# Patient Record
Sex: Female | Born: 1980 | Race: White | Hispanic: No | Marital: Married | Smoking: Former smoker
Health system: Southern US, Community
[De-identification: ages and names within clinical notes are randomized; demographics above are authoritative.]

## PROBLEM LIST (undated history)

## (undated) DIAGNOSIS — K219 Gastro-esophageal reflux disease without esophagitis: Secondary | ICD-10-CM

## (undated) DIAGNOSIS — I4711 Inappropriate sinus tachycardia, so stated: Secondary | ICD-10-CM

## (undated) DIAGNOSIS — R000 Tachycardia, unspecified: Secondary | ICD-10-CM

## (undated) HISTORY — PX: TONSILLECTOMY: SUR1361

## (undated) HISTORY — DX: Inappropriate sinus tachycardia, so stated: I47.11

## (undated) HISTORY — DX: Gastro-esophageal reflux disease without esophagitis: K21.9

## (undated) HISTORY — DX: Tachycardia, unspecified: R00.0

---

## 1998-06-10 ENCOUNTER — Other Ambulatory Visit: Admission: RE | Admit: 1998-06-10 | Discharge: 1998-06-10 | Payer: Self-pay | Admitting: Obstetrics & Gynecology

## 1998-11-04 ENCOUNTER — Other Ambulatory Visit: Admission: RE | Admit: 1998-11-04 | Discharge: 1998-11-04 | Payer: Self-pay | Admitting: Obstetrics & Gynecology

## 1999-07-12 ENCOUNTER — Other Ambulatory Visit: Admission: RE | Admit: 1999-07-12 | Discharge: 1999-07-12 | Payer: Self-pay | Admitting: Obstetrics & Gynecology

## 2000-07-26 ENCOUNTER — Encounter: Admission: RE | Admit: 2000-07-26 | Discharge: 2000-07-26 | Payer: Self-pay | Admitting: Obstetrics & Gynecology

## 2000-07-26 ENCOUNTER — Encounter: Payer: Self-pay | Admitting: Obstetrics & Gynecology

## 2000-07-26 ENCOUNTER — Other Ambulatory Visit: Admission: RE | Admit: 2000-07-26 | Discharge: 2000-07-26 | Payer: Self-pay | Admitting: Obstetrics & Gynecology

## 2000-08-23 ENCOUNTER — Other Ambulatory Visit: Admission: RE | Admit: 2000-08-23 | Discharge: 2000-08-23 | Payer: Self-pay | Admitting: Obstetrics & Gynecology

## 2000-11-28 ENCOUNTER — Other Ambulatory Visit: Admission: RE | Admit: 2000-11-28 | Discharge: 2000-11-28 | Payer: Self-pay | Admitting: Obstetrics & Gynecology

## 2001-06-05 ENCOUNTER — Other Ambulatory Visit: Admission: RE | Admit: 2001-06-05 | Discharge: 2001-06-05 | Payer: Self-pay | Admitting: Obstetrics & Gynecology

## 2001-12-17 ENCOUNTER — Other Ambulatory Visit: Admission: RE | Admit: 2001-12-17 | Discharge: 2001-12-17 | Payer: Self-pay | Admitting: Obstetrics & Gynecology

## 2002-07-12 ENCOUNTER — Other Ambulatory Visit: Admission: RE | Admit: 2002-07-12 | Discharge: 2002-07-12 | Payer: Self-pay | Admitting: Obstetrics & Gynecology

## 2003-07-16 ENCOUNTER — Other Ambulatory Visit: Admission: RE | Admit: 2003-07-16 | Discharge: 2003-07-16 | Payer: Self-pay | Admitting: Obstetrics & Gynecology

## 2004-07-28 ENCOUNTER — Other Ambulatory Visit: Admission: RE | Admit: 2004-07-28 | Discharge: 2004-07-28 | Payer: Self-pay | Admitting: Obstetrics & Gynecology

## 2010-12-20 ENCOUNTER — Other Ambulatory Visit (HOSPITAL_COMMUNITY): Payer: Self-pay | Admitting: Obstetrics & Gynecology

## 2010-12-20 DIAGNOSIS — N979 Female infertility, unspecified: Secondary | ICD-10-CM

## 2010-12-24 ENCOUNTER — Ambulatory Visit (HOSPITAL_COMMUNITY)
Admission: RE | Admit: 2010-12-24 | Discharge: 2010-12-24 | Disposition: A | Discharge status: Home / Self Care | Payer: BC Managed Care – PPO | Source: Ambulatory Visit | Attending: Obstetrics & Gynecology | Admitting: Obstetrics & Gynecology

## 2010-12-24 DIAGNOSIS — N979 Female infertility, unspecified: Secondary | ICD-10-CM

## 2010-12-24 MED ORDER — IOHEXOL 300 MG/ML  SOLN: 5.0000 mL | Freq: Once | INTRAMUSCULAR | Status: AC | PRN

## 2011-09-23 ENCOUNTER — Encounter: Payer: Self-pay | Admitting: Internal Medicine

## 2011-10-25 ENCOUNTER — Encounter: Payer: Self-pay | Admitting: Internal Medicine

## 2011-10-26 ENCOUNTER — Ambulatory Visit (INDEPENDENT_AMBULATORY_CARE_PROVIDER_SITE_OTHER): Payer: BC Managed Care – PPO | Admitting: Internal Medicine

## 2011-10-26 ENCOUNTER — Encounter: Payer: Self-pay | Admitting: Internal Medicine

## 2011-10-26 VITALS — BP 110/72 | HR 59 | Ht 65.0 in | Wt 140.2 lb

## 2011-10-26 DIAGNOSIS — R1013 Epigastric pain: Secondary | ICD-10-CM | POA: Insufficient documentation

## 2011-10-26 DIAGNOSIS — K5900 Constipation, unspecified: Secondary | ICD-10-CM

## 2011-10-26 DIAGNOSIS — R1011 Right upper quadrant pain: Secondary | ICD-10-CM

## 2011-10-26 DIAGNOSIS — R000 Tachycardia, unspecified: Secondary | ICD-10-CM

## 2011-10-26 DIAGNOSIS — K219 Gastro-esophageal reflux disease without esophagitis: Secondary | ICD-10-CM

## 2011-10-26 MED ORDER — PANTOPRAZOLE SODIUM 40 MG PO TBEC: 40.0000 mg | DELAYED_RELEASE_TABLET | Freq: Every day | ORAL | Status: DC

## 2011-10-26 MED ORDER — POLYETHYLENE GLYCOL 3350 17 GM/SCOOP PO POWD: 17.0000 g | Freq: Every day | ORAL | Status: AC

## 2011-10-26 NOTE — Progress Notes (Signed)
Patient ID: Catherine Sanders, female   DOB: August 25, 1980, 31 y.o.   MRN: [id number]  SUBJECTIVE: HPI Catherine Sanders is a 31 yo female with past medical history of tachycardia who is seen for evaluation of epigastric and right upper quadrant abdominal pain, heartburn and constipation. The patient reports ongoing issues with severe heartburn over the last 1 year. For this she has been using Rolaids, and also tried Prilosec which helped somewhat, but seemed to worsen her constipation and thus he discontinued this medication. She does report globus sensation associated with her heartburn as well as some nausea which is worse in the afternoon. She is not vomiting. Occasionally she feels issues with swallowing, but food does not stick, and relates this more to the globus sensation. She also reports epigastric and right upper quadrant pain which is worse after eating. This pain usually starts 20 minutes after eating, and has not woken her from sleep. She also reports some issues with ongoing constipation which has been worse over the past 2 years. She reports hard stools occurring every 3-4 days. In years past she has gone once to twice daily. She has tried over-the-counter senna but was nervous to take this on a daily basis. She is also tried stool softeners which has helped a little. She's had no fevers or chills. No melena or hematochezia. She does not use NSAIDs.  Review of Systems  As per history of present illness, otherwise negative   Past Medical History  Diagnosis Date  . Inappropriate sinus node tachycardia     Current Outpatient Prescriptions  Medication Sig Dispense Refill  . metoprolol succinate (TOPROL-XL) 25 MG 24 hr tablet Take 25 mg by mouth daily.      . pantoprazole (PROTONIX) 40 MG tablet Take 1 tablet (40 mg total) by mouth daily.  30 tablet  1  . polyethylene glycol powder (GLYCOLAX/MIRALAX) powder Take 17 g by mouth daily.  255 g  3  . DISCONTD: pantoprazole (PROTONIX) 40 MG tablet Take 1  tablet (40 mg total) by mouth daily.  30 tablet  1    No Known Allergies  Family History  Problem Relation Age of Onset  . Diabetes Maternal Grandmother   . Throat cancer Paternal Uncle     Tobacco user    History  Substance Use Topics  . Smoking status: Current Everyday Smoker    Types: Cigarettes  . Smokeless tobacco: Never Used  . Alcohol Use: Yes    OBJECTIVE: BP 110/72  Pulse 59  Ht 5\' 5"  (1.651 m)  Wt 140 lb 3.2 oz (63.594 kg)  BMI 23.33 kg/m2  SpO2 98%  LMP 10/10/2011 Constitutional: Well-developed and well-nourished. No distress. HEENT: Normocephalic and atraumatic. Oropharynx is clear and moist. No oropharyngeal exudate. Conjunctivae are normal. Pupils are equal round and reactive to light. No scleral icterus. Neck: Neck supple. Trachea midline. Cardiovascular: Normal rate, regular rhythm and intact distal pulses. No M/R/G Pulmonary/chest: Effort normal and breath sounds normal. No wheezing, rales or rhonchi. Abdominal: Soft, nontender, nondistended. Bowel sounds active throughout. There are no masses palpable. No hepatosplenomegaly. Extremities: no clubbing, cyanosis, or edema Lymphadenopathy: No cervical adenopathy noted. Neurological: Alert and oriented to person place and time. Skin: Skin is warm and dry. No rashes noted. Psychiatric: Normal mood and affect. Behavior is normal.  ASSESSMENT AND PLAN: 31 yo female with past medical history of tachycardia who is seen for evaluation of epigastric and right upper quadrant abdominal pain, heartburn and constipation.  1. Epigastric/RUQ pain/heartburn --  the  patient's symptoms are most consistent with GERD and ulcer-like dyspepsia. Gallbladder dysfunction/biliary tract disease is felt less likely at this point, however I would like to perform an ultrasound of her abdomen to better evaluate her gallbladder and rule out stones and sludge. I will start her on Protonix 40 mg daily, and advised her to take this 30 minutes  to one hour before her first meal the day. She voices understanding. We've also discussed foods which can make reflux worse such as acidic foods, tobacco, alcohol, and caffeine.  2. Constipation -- her constipation seems most consistent with irritable bowel given her intermittent lower abdominal cramping. I would like to start her on MiraLAX 17 g daily. If her stools are too loose she can take this every other day, but I expect she will need to take it on a consistent basis to get relief. We discussed how we have other options regarding laxatives, but we will start with this and gauge her response at followup  She will return in 6 weeks or sooner if needed

## 2011-10-26 NOTE — Patient Instructions (Addendum)
You have been scheduled for an Abdominal U/S at [facility] on 10/28/2011  @ 9:00am Please arrive to Radiology at 8:45am have nothing to eat or drink after midnight before you U/S  We have sent the following medications to your pharmacy for you to pick up at your convenience: Protonix, Miralax; Please take as directed  Follow up with Dr. Margretta Sidle in office in 6 weeks

## 2011-10-28 ENCOUNTER — Ambulatory Visit (HOSPITAL_COMMUNITY)
Admission: RE | Admit: 2011-10-28 | Discharge: 2011-10-28 | Disposition: A | Discharge status: Home / Self Care | Payer: BC Managed Care – PPO | Source: Ambulatory Visit | Attending: Internal Medicine | Admitting: Internal Medicine

## 2011-10-28 DIAGNOSIS — K219 Gastro-esophageal reflux disease without esophagitis: Secondary | ICD-10-CM

## 2011-10-28 DIAGNOSIS — K5900 Constipation, unspecified: Secondary | ICD-10-CM | POA: Insufficient documentation

## 2011-12-12 ENCOUNTER — Encounter: Payer: Self-pay | Admitting: Internal Medicine

## 2011-12-13 ENCOUNTER — Encounter: Payer: Self-pay | Admitting: Internal Medicine

## 2011-12-13 ENCOUNTER — Ambulatory Visit (INDEPENDENT_AMBULATORY_CARE_PROVIDER_SITE_OTHER): Payer: BC Managed Care – PPO | Admitting: Internal Medicine

## 2011-12-13 VITALS — BP 100/60 | HR 90 | Ht 65.0 in | Wt 145.0 lb

## 2011-12-13 DIAGNOSIS — K5900 Constipation, unspecified: Secondary | ICD-10-CM

## 2011-12-13 DIAGNOSIS — K219 Gastro-esophageal reflux disease without esophagitis: Secondary | ICD-10-CM

## 2011-12-13 MED ORDER — POLYETHYLENE GLYCOL 3350 17 GM/SCOOP PO POWD: 17.0000 g | Freq: Every day | ORAL | Status: DC

## 2011-12-13 MED ORDER — PANTOPRAZOLE SODIUM 40 MG PO TBEC: 40.0000 mg | DELAYED_RELEASE_TABLET | Freq: Every day | ORAL | Status: DC

## 2011-12-13 NOTE — Progress Notes (Signed)
Subjective:    Patient ID: Catherine Sanders, female    DOB: 01-Oct-1980, 31 y.o.   MRN: [id number]  HPI [name] is a 31 yo female with past medical history of tachycardia who is seen in followup of right upper quadrant abdominal pain heartburn constipation.  Since her last visit she has been maintained on pantoprazole 40 mg daily. She's also been using MiraLAX 17 g daily. She reports she is much improved. Her heartburn has completely resolved and she is now able to eat what she desires without pain. She's also no longer having upper abdominal pain. Appetite has been good. She did have one day of globus sensation which occurred during a vacation, but this also resolved. She feels this may have been related to change in diet on her vacation. She denies dysphagia, odynophagia, or globus today. No melena or rectal bleeding. Her constipation is also responded very nicely to MiraLAX. She is having 2 soft but formed bowel movements daily. She's also recently stopped smoking and plans to try hard not to restart  Review of Systems As per history of present illness, otherwise negative  Current Medications, Allergies, Past Medical History, Past Surgical History, Family History and Social History were reviewed in [employer] record.     Objective:   Physical Exam BP 100/60  Pulse 90  Ht 5\' 5"  (1.651 m)  Wt 145 lb (65.772 kg)  BMI 24.13 kg/m2 Constitutional: Well-developed and well-nourished. No distress. HEENT: Normocephalic and atraumatic. Oropharynx is clear and moist. No oropharyngeal exudate. Conjunctivae are normal.  No scleral icterus. Cardiovascular: Normal rate, regular rhythm and intact distal pulses. No M/R/G Pulmonary/chest: Effort normal and breath sounds normal. No wheezing, rales or rhonchi. Abdominal: Soft, nontender, nondistended. Bowel sounds active throughout. There are no masses palpable. No hepatosplenomegaly. Extremities: no clubbing, cyanosis, or  edema Neurological: Alert and oriented to person place and time. Skin: Skin is warm and dry. No rashes noted. Psychiatric: Normal mood and affect. Behavior is normal.  COMPLETE ABDOMINAL ULTRASOUND 10/26/2011   Comparison:  None.   Findings:   Gallbladder:  No gallstones, gallbladder wall thickening, or pericholecystic fluid.   Common bile duct:   Normal caliber, 5 mm.   Liver:  No focal lesion identified.  Within normal limits in parenchymal echogenicity.   IVC:  Appears normal.   Pancreas:  No focal abnormality seen.   Spleen:  Within normal limits in size and echotexture.   Right Kidney:   Normal in size and parenchymal echogenicity.  No evidence of mass or hydronephrosis.   Left Kidney:  Normal in size and parenchymal echogenicity.  No evidence of mass or hydronephrosis.   Abdominal aorta:  No aneurysm identified.   IMPRESSION: Negative abdominal ultrasound.      Assessment & Plan:  31 yo female with past medical history of tachycardia who is seen in followup of right upper quadrant abdominal pain heartburn constipation  1.  GERD -- the patient's heartburn and reflux symptoms have completely resolved with pantoprazole. She will continue pantoprazole 40 mg daily. We discussed how she can use this medication daily going forward, but also after 2 or 3 months she can try to wean off and gauge her symptoms. If she does need this medication long-term, we discussed the small associated with osteopenia and long-term use of PPI. If she does take this medication long-term, I encourage bone density study when age appropriate.  2.  Constipation -- great response to MiraLAX, she can continue 17 g daily.  She is thinking about becoming pregnant, and I've encouraged her to discuss the long-term use of pantoprazole and MiraLAX during pregnancy with her obstetrician. I will defer to their advice regarding these medications during pregnancy.

## 2011-12-13 NOTE — Patient Instructions (Addendum)
We have sent the following medications to your pharmacy for you to pick up at your convenience: Protonix   Follow up with Dr. Terri Piedra in 1 year

## 2012-12-04 ENCOUNTER — Other Ambulatory Visit: Payer: Self-pay | Admitting: Internal Medicine

## 2012-12-12 ENCOUNTER — Encounter: Payer: Self-pay | Admitting: Internal Medicine

## 2012-12-18 ENCOUNTER — Encounter: Payer: Self-pay | Admitting: Internal Medicine

## 2012-12-18 ENCOUNTER — Ambulatory Visit (INDEPENDENT_AMBULATORY_CARE_PROVIDER_SITE_OTHER): Payer: BC Managed Care – PPO | Admitting: Internal Medicine

## 2012-12-18 VITALS — BP 102/68 | HR 64 | Ht 65.0 in | Wt 143.4 lb

## 2012-12-18 DIAGNOSIS — K219 Gastro-esophageal reflux disease without esophagitis: Secondary | ICD-10-CM

## 2012-12-18 DIAGNOSIS — K5909 Other constipation: Secondary | ICD-10-CM

## 2012-12-18 DIAGNOSIS — K5900 Constipation, unspecified: Secondary | ICD-10-CM

## 2012-12-18 MED ORDER — PANTOPRAZOLE SODIUM 40 MG PO TBEC: 40.0000 mg | DELAYED_RELEASE_TABLET | Freq: Every day | ORAL | Status: DC

## 2012-12-18 NOTE — Progress Notes (Signed)
  Subjective:    Patient ID: Catherine Sanders, female    DOB: Aug 26, 1980, 32 y.o.   MRN: [id number]  HPI Catherine Sanders is a 32 yo female seen in followup for her history of GERD and CIC.  She is here alone today. She was last seen in October 2013.  On initial evaluation she was having trouble with daily heartburn and right upper quadrant abdominal pain. She was started on pantoprazole and her symptoms dramatically improved. She returns today stating she is feeling very well. She has continued pantoprazole 40 mg daily. She denies having breakthrough heartburn. No epigastric or right upper quadrant abdominal pain. She is eating well with no nausea or vomiting. No dysphagia or odynophagia. Bowel habits been regular when using MiraLax. When she stops MiraLax she does have return of mild constipation. She is using MiraLax every day to every other day with good result. No blood in her stool or melena. She is trying actively to get pregnant and working with a fertility specialist. She is about to undergo IUI.     Review of Systems As per history of present illness, otherwise negative  Current Medications, Allergies, Past Medical History, Past Surgical History, Family History and Social History were reviewed in [employer] record.     Objective:   Physical Exam BP 102/68  Pulse 64  Ht 5\' 5"  (1.651 m)  Wt 143 lb 6.4 oz (65.046 kg)  BMI 23.86 kg/m2  LMP 12/14/2012 Constitutional: Well-developed and well-nourished. No distress. HEENT: Normocephalic and atraumatic. Oropharynx is clear and moist. No oropharyngeal exudate. Conjunctivae are normal.  No scleral icterus. Cardiovascular: Normal rate, regular rhythm and intact distal pulses. No M/R/G Pulmonary/chest: Effort normal and breath sounds normal. No wheezing, rales or rhonchi. Abdominal: Soft, nontender, nondistended. Bowel sounds active throughout. There are no masses palpable. No hepatosplenomegaly. Extremities: no clubbing,  cyanosis, or edema Neurological: Alert and oriented to person place and time. Skin: Skin is warm and dry. No rashes noted. Psychiatric: Normal mood and affect. Behavior is normal.     Assessment & Plan:  32 yo female seen in followup for her history of GERD and CIC.    1.  GERD -- well-controlled on daily PPI. We have again discussed daily use of PPI medication.  For now she would like to continue this medication on a daily basis, which is reasonable. We discussed that should she desire to wean off, I would do so by decreasing to every other day administration. We have discussed the possibility for rebound heartburn when coming off long-term PPI. If this occurs she can use over-the-counter Zantac or Pepcid per box instruction. For now she plans to stay on PPI. We discussed the possibilities for impaired calcium absorption associated with PPI, and she is taking calcium on a daily basis. I also have asked that she discuss ongoing PPI should she become pregnant with her maternal fetal medicine specialist/obstetrician.  2.  Chronic mild constipation -- she has done well with MiraLax therapy. She can continue this on a daily to every other day schedule. She is very happy with the result and this plan. Again she will discuss this medication with her obstetrician should she become pregnant.  She can followup in one year, sooner if necessary

## 2012-12-18 NOTE — Patient Instructions (Signed)
Continue taking protonix daily and Miralax.  Follow up with Dr. Rhea Belton in 1 year                                               We are excited to introduce MyChart, a new best-in-class service that provides you online access to important information in your electronic medical record. We want to make it easier for you to view your health information - all in one secure location - when and where you need it. We expect MyChart will enhance the quality of care and service we provide.  When you register for MyChart, you can:    View your test results.    Request appointments and receive appointment reminders via email.    Request medication renewals.    View your medical history, allergies, medications and immunizations.    Communicate with your physician's office through a password-protected site.    Conveniently print information such as your medication lists.  To find out if MyChart is right for you, please talk to a member of our clinical staff today. We will gladly answer your questions about this free health and wellness tool.  If you are age 34 or older and want a member of your family to have access to your record, you must provide written consent by completing a proxy form available at our office. Please speak to our clinical staff about guidelines regarding accounts for patients younger than age 80.  As you activate your MyChart account and need any technical assistance, please call the MyChart technical support line at [phone]) [phone]-CHART [phone]) or email your question to [email or web address]. If you email your question(s), please include your name, a return phone number and the best time to reach you.  If you have non-urgent health-related questions, you can send a message to our office through MyChart at [email or web address]. If you have a medical emergency, call 911.  Thank you for using MyChart as your new health and wellness resource!   MyChart licensed from [employer],  4782-9562. Patents Pending.

## 2013-11-29 ENCOUNTER — Telehealth: Payer: Self-pay | Admitting: Internal Medicine

## 2013-11-29 MED ORDER — PANTOPRAZOLE SODIUM 40 MG PO TBEC: 40.0000 mg | DELAYED_RELEASE_TABLET | Freq: Every day | ORAL | Status: DC

## 2013-11-29 NOTE — Telephone Encounter (Signed)
Rx sent 

## 2014-02-04 ENCOUNTER — Ambulatory Visit (INDEPENDENT_AMBULATORY_CARE_PROVIDER_SITE_OTHER): Payer: BC Managed Care – PPO | Admitting: Internal Medicine

## 2014-02-04 ENCOUNTER — Other Ambulatory Visit (INDEPENDENT_AMBULATORY_CARE_PROVIDER_SITE_OTHER): Payer: BC Managed Care – PPO

## 2014-02-04 ENCOUNTER — Encounter: Payer: Self-pay | Admitting: Internal Medicine

## 2014-02-04 VITALS — BP 92/66 | HR 68 | Ht 65.0 in | Wt 147.8 lb

## 2014-02-04 DIAGNOSIS — K5900 Constipation, unspecified: Secondary | ICD-10-CM

## 2014-02-04 DIAGNOSIS — K5904 Chronic idiopathic constipation: Secondary | ICD-10-CM

## 2014-02-04 DIAGNOSIS — K219 Gastro-esophageal reflux disease without esophagitis: Secondary | ICD-10-CM

## 2014-02-04 LAB — CBC WITH DIFFERENTIAL/PLATELET
BASOS PCT: 0.1 % (ref 0.0–3.0)
Basophils Absolute: 0 10*3/uL (ref 0.0–0.1)
EOS PCT: 2.2 % (ref 0.0–5.0)
Eosinophils Absolute: 0.2 10*3/uL (ref 0.0–0.7)
HCT: 39.1 % (ref 36.0–46.0)
Hemoglobin: 13.1 g/dL (ref 12.0–15.0)
LYMPHS PCT: 25.9 % (ref 12.0–46.0)
Lymphs Abs: 2.7 10*3/uL (ref 0.7–4.0)
MCHC: 33.6 g/dL (ref 30.0–36.0)
MCV: 93.2 fl (ref 78.0–100.0)
MONOS PCT: 6.3 % (ref 3.0–12.0)
Monocytes Absolute: 0.6 10*3/uL (ref 0.1–1.0)
NEUTROS PCT: 65.5 % (ref 43.0–77.0)
Neutro Abs: 6.7 10*3/uL (ref 1.4–7.7)
PLATELETS: 325 10*3/uL (ref 150.0–400.0)
RBC: 4.19 Mil/uL (ref 3.87–5.11)
RDW: 12.7 % (ref 11.5–15.5)
WBC: 10.3 10*3/uL (ref 4.0–10.5)

## 2014-02-04 MED ORDER — PANTOPRAZOLE SODIUM 40 MG PO TBEC: 40.0000 mg | DELAYED_RELEASE_TABLET | Freq: Every day | ORAL | Status: DC

## 2014-02-04 NOTE — Patient Instructions (Addendum)
We have sent  medications to your pharmacy for you to pick up at your convenience. Take your miralax every other day. Your physician has requested that you go to the basement for lab work before leaving today. Please follow up with Dr Rhea BeltonPyrtle in 1 year. CC:  Annamaria Hellingobert Wein MD

## 2014-02-04 NOTE — Progress Notes (Signed)
   Subjective:    Patient ID: Catherine Sanders, female    DOB: 26-Jan-1981, 33 y.o.   MRN: [id number]  HPI Catherine Sanders is a 33 yo female with PMH of GERD, CIC who is here for follow-up. She reports she has been doing very well. Her heartburn remains completely controlled on pantoprazole 40 mg daily. No trouble swallowing or painful swallowing. No nausea or vomiting. Good appetite. Stable weight. No abdominal pain. Bowel movements are regular on every other day MiraLAX. Without MiraLAX she does have trouble with constipation. She tried to wean off PPI as we discussed at last visit but she had return of heartburn on a near daily basis as well as globus sensation. She resume pantoprazole and all symptoms abated. Periods are regular.  Review of Systems As per HPI, otherwise normal  Current Medications, Allergies, Past Medical History, Past Surgical History, Family History and Social History were reviewed in [facility] electronic medical record.     Objective:   Physical Exam BP 92/66 mmHg  Pulse 68  Ht 5\' 5"  (1.651 m)  Wt 147 lb 12.8 oz (67.042 kg)  BMI 24.60 kg/m2  LMP 01/22/2014 (Exact Date) Constitutional: Well-developed and well-nourished. No distress. HEENT: Normocephalic and atraumatic. Oropharynx is clear and moist. No oropharyngeal exudate. Conjunctivae are normal.  No scleral icterus. Neck: Neck supple. Trachea midline. Cardiovascular: Normal rate, regular rhythm and intact distal pulses. No M/R/G Pulmonary/chest: Effort normal and breath sounds normal. No wheezing, rales or rhonchi. Abdominal: Soft, nontender, nondistended. Bowel sounds active throughout. There are no masses palpable. No hepatosplenomegaly. Extremities: no clubbing, cyanosis, or edema Lymphadenopathy: No cervical adenopathy noted. Neurological: Alert and oriented to person place and time. Psychiatric: Normal mood and affect. Behavior is normal.  Labs -- ordered today, BMET, Mg, CBC     Assessment & Plan:    33 yo female with PMH of GERD, CIC who is here for follow-up.  1. GERD -- well-controlled on daily PPI. She tried to wean off but had recurrent symptoms necessitating daily therapy. Continue pantoprazole 40 mg daily. Check magnesium level today. We had previously discussed the long-term potential side effects of PPI and we reviewed these today.  2. Chronic idiopathic constipation -- improved with every other day MiraLAX. She will continue MiraLAX 17 g every other day for now.  Follow-up in one year

## 2014-02-05 LAB — BASIC METABOLIC PANEL
BUN: 11 mg/dL (ref 6–23)
CHLORIDE: 103 meq/L (ref 96–112)
CO2: 26 meq/L (ref 19–32)
Calcium: 9.4 mg/dL (ref 8.4–10.5)
Creatinine, Ser: 0.7 mg/dL (ref 0.4–1.2)
GFR: 107.94 mL/min (ref >60.00)
GLUCOSE: 85 mg/dL (ref 70–99)
POTASSIUM: 4 meq/L (ref 3.5–5.1)
SODIUM: 137 meq/L (ref 135–145)

## 2014-02-05 LAB — MAGNESIUM: Magnesium: 2 mg/dL (ref 1.5–2.5)

## 2014-08-13 ENCOUNTER — Other Ambulatory Visit: Payer: Self-pay | Admitting: Internal Medicine

## 2014-09-10 ENCOUNTER — Other Ambulatory Visit: Payer: Self-pay | Admitting: Internal Medicine

## 2015-04-03 ENCOUNTER — Ambulatory Visit (INDEPENDENT_AMBULATORY_CARE_PROVIDER_SITE_OTHER): Payer: BLUE CROSS/BLUE SHIELD | Admitting: Internal Medicine

## 2015-04-03 ENCOUNTER — Encounter: Payer: Self-pay | Admitting: Internal Medicine

## 2015-04-03 VITALS — BP 106/66 | HR 72 | Ht 65.0 in | Wt 132.0 lb

## 2015-04-03 DIAGNOSIS — K219 Gastro-esophageal reflux disease without esophagitis: Secondary | ICD-10-CM | POA: Diagnosis not present

## 2015-04-03 MED ORDER — PANTOPRAZOLE SODIUM 40 MG PO TBEC: 40.0000 mg | DELAYED_RELEASE_TABLET | Freq: Every day | ORAL | Status: DC

## 2015-04-03 NOTE — Progress Notes (Signed)
   Subjective:    Patient ID: Catherine Sanders, female    DOB: [birth year], 35 y.o.   MRN: [id number]  HPI Jessalynn is a 35 year old female with a past medical history of GERD and constipation is here for follow-up. She was last seen in December 2015. She reports she's been doing very well. She has had somewhat resolution of constipation and is now no longer needing MiraLAX. Without the drug bowels a been mostly regular, formed and brown without diarrhea or constipation. No blood in her stool or melena. No dominant pain. She is using MiraLAX on a rare basis for mild constipation. Heartburn is well controlled without dysphagia, odynophagia, nausea, vomiting or early satiety if she takes pantoprazole daily. Without it she does have nocturnal heartburn with coughing and at times wakes up feeling short of breath. She wishes to continue taking this medication daily.   Review of Systems As per history of present illness, otherwise negative  Current Medications, Allergies, Past Medical History, Past Surgical History, Family History and Social History were reviewed in [employer] record.     Objective:   Physical Exam BP 106/66 mmHg  Pulse 72  Ht  (1.651 m)  Wt 132 lb (59.875 kg)  BMI 21.97 kg/m2 Constitutional: Well-developed and well-nourished. No distress. HEENT: Normocephalic and atraumatic.  Conjunctivae are normal.  No scleral icterus. Neck: Neck supple. Trachea midline. Cardiovascular: Normal rate, regular rhythm and intact distal pulses. No M/R/G Pulmonary/chest: Effort normal and breath sounds normal. No wheezing, rales or rhonchi. Abdominal: Soft, nontender, nondistended. Bowel sounds active throughout. There are no masses palpable. No hepatosplenomegaly. Extremities: no clubbing, cyanosis, or edema Neurological: Alert and oriented to person place and time. Skin: Skin is warm and dry.  Psychiatric: Normal mood and affect. Behavior is normal.     Assessment & Plan:   35 year old female with a past medical history of GERD and constipation is here for follow-up.  1. GERD -- well-controlled on pantoprazole. Continue 40 mg daily. No alarm symptoms. We did discuss long-term PPI use and possible interference with calcium absorption and bone health. Given her moderate to severe symptoms off medication and after this discussion she wishes to continue with therapy. Options should she want to change therapy would be twice a day H2 blocker  2. Chronic constipation -- less of an issue over the last year. She can use MiraLAX on an as-needed basis.  One year follow-up, sooner if necessary

## 2015-04-03 NOTE — Patient Instructions (Signed)
Follow up in 1 year. Refills have been sent to your pharmacy for Protonix.

## 2016-03-28 ENCOUNTER — Encounter: Payer: Self-pay | Admitting: *Deleted

## 2016-03-29 ENCOUNTER — Encounter: Payer: Self-pay | Admitting: Internal Medicine

## 2016-03-29 ENCOUNTER — Ambulatory Visit (INDEPENDENT_AMBULATORY_CARE_PROVIDER_SITE_OTHER): Payer: BLUE CROSS/BLUE SHIELD | Admitting: Internal Medicine

## 2016-03-29 VITALS — BP 102/66 | HR 76 | Ht 64.75 in | Wt 142.1 lb

## 2016-03-29 DIAGNOSIS — K219 Gastro-esophageal reflux disease without esophagitis: Secondary | ICD-10-CM

## 2016-03-29 MED ORDER — PANTOPRAZOLE SODIUM 40 MG PO TBEC: 40.0000 mg | DELAYED_RELEASE_TABLET | Freq: Every day | ORAL | 3 refills | Status: DC

## 2016-03-29 NOTE — Patient Instructions (Signed)
We have sent the following prescriptions to your mail in pharmacy: Pantoprazole 40 mg daily  If you have not heard from your mail in pharmacy within 1 week or if you have not received your medication in the mail, please contact us at [phone] so we may find out why.  Please follow up with Dr Rhea BeltonPyrtle in 1 year.  If you are age 38 or older, your body mass index should be between 23-30. Your Body mass index is 23.83 kg/m. If this is out of the aforementioned range listed, please consider follow up with your Primary Care Provider.  If you are age 38 or younger, your body mass index should be between 19-25. Your Body mass index is 23.83 kg/m. If this is out of the aformentioned range listed, please consider follow up with your Primary Care Provider.

## 2016-03-29 NOTE — Progress Notes (Signed)
   Subjective:    Patient ID: Catherine Sanders, female    DOB: December 24, 1980, 36 y.o.   MRN: [id number]  HPI Alcario Droughtrica is a 36 year old female with history of GERD and mild constipation is here for follow-up. She was last seen one year ago. Reports she's been doing well. Resolution of constipation and has not needed MiraLAX. Stools are regular occurring daily. No diarrhea, constipation, rectal bleeding or melena. Her reflux is well controlled pantoprazole 40 mg daily. She's tried to go without this but symptoms recur and are moderate. No dysphagia or odynophagia. No abdominal pain. No nausea or vomiting.  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     Objective:   Physical Exam BP 102/66 (BP Location: Left Arm, Patient Position: Sitting, Cuff Size: Normal)   Pulse 76   Ht 5' 4.75" (1.645 m) Comment: height measured without shoes  Wt 142 lb 2 oz (64.5 kg)   LMP 03/03/2016   BMI 23.83 kg/m  Constitutional: Well-developed and well-nourished. No distress. HEENT: Normocephalic and atraumatic.  Conjunctivae are normal.  No scleral icterus. Neck: Neck supple. Trachea midline. Cardiovascular: Normal rate, regular rhythm and intact distal pulses. No M/R/G Pulmonary/chest: Effort normal and breath sounds normal. No wheezing, rales or rhonchi. Abdominal: Soft, nontender, nondistended. Bowel sounds active throughout. There are no masses palpable. No hepatosplenomegaly. Extremities: no clubbing, cyanosis, or edema Neurological: Alert and oriented to person place and time. Skin: Skin is warm and dry.  Psychiatric: Normal mood and affect. Behavior is normal.     Assessment & Plan:   36 year old female with history of GERD and mild constipation is here for follow-up.   1. GERD -- Chronic, stable on PPI therapy without alarm symptoms. Attempt to discontinue PPI resulted in recurrent symptoms. We discussed the risks, benefits and alternatives chronic PPI and she wishes to continue current therapy. Refills provided 1 year, follow-up in one year, sooner necessary  15 minutes spent with the patient today. Greater than 50% was spent in counseling and coordination of care with the patient

## 2017-03-01 ENCOUNTER — Other Ambulatory Visit: Payer: Self-pay | Admitting: Internal Medicine

## 2017-03-01 NOTE — Telephone Encounter (Signed)
Called pt and LM to call back and schedule a 1 year F/U appt, necessary for future refills.

## 2017-04-24 ENCOUNTER — Encounter: Payer: Self-pay | Admitting: Internal Medicine

## 2017-04-24 ENCOUNTER — Ambulatory Visit (INDEPENDENT_AMBULATORY_CARE_PROVIDER_SITE_OTHER): Payer: BLUE CROSS/BLUE SHIELD | Admitting: Internal Medicine

## 2017-04-24 VITALS — BP 108/70 | HR 76 | Ht 65.0 in | Wt 141.1 lb

## 2017-04-24 DIAGNOSIS — K219 Gastro-esophageal reflux disease without esophagitis: Secondary | ICD-10-CM

## 2017-04-24 NOTE — Patient Instructions (Signed)
Please try to wean off of your Protonix. Take 1 tablet every other day x 2 weeks, then discontinue.  You may take Zantac 150 mg as needed for sporatic heartburn. If you find that you are having frequent heartburn, you can restart Protonix but will need to let us know first.  Please purchase the following medications over the counter and take as directed: Zantac 150 mg   If you are age 39 or older, your body mass index should be between 23-30. Your Body mass index is 23.48 kg/m. If this is out of the aforementioned range listed, please consider follow up with your Primary Care Provider.  If you are age 39 or younger, your body mass index should be between 19-25. Your Body mass index is 23.48 kg/m. If this is out of the aformentioned range listed, please consider follow up with your Primary Care Provider.

## 2017-04-24 NOTE — Progress Notes (Signed)
   Subjective:    Patient ID: Catherine Sanders, female    DOB: 06-Sep-1980, 37 y.o.   MRN: [id number]  HPI Catherine Sanders is a 37 year old female with a history of GERD and mild constipation is here for follow-up.  She was last seen 1 year ago.  She has been doing very well on pantoprazole 40 mg daily.  She denies heartburn, dysphagia and odynophagia.  No abdominal pain.  Bowel movements have been more regular without blood in her stool or melena.  She would like to try to wean off of pantoprazole.  She has also decreased the frequency of which she drinks alcohol which she feels was a prior trigger for her.  No new complaints No change in family history  Review of Systems As per HPI, otherwise negative  Current Medications, Allergies, Past Medical History, Past Surgical History, Family History and Social History were reviewed in [facility] electronic medical record.     Objective:   Physical Exam BP 108/70   Pulse 76   Ht 5\' 5"  (1.651 m)   Wt 141 lb 2 oz (64 kg)   BMI 23.48 kg/m  Constitutional: Well-developed and well-nourished. No distress. HEENT: Normocephalic and atraumatic.  Conjunctivae are normal.  No scleral icterus. Skin: Skin is warm and dry. Psychiatric: Normal mood and affect. Behavior is normal.     Assessment & Plan:  37 year old female with a history of GERD and mild constipation is here for follow-up.  She was last seen 1 year ago.   1. GERD --we reviewed GERD diet and lifestyle modification.  I agree with trying to wean off pantoprazole.  I advised that she use this every other day for approximately 2 weeks and then try to stop.  If heartburn/reflux symptoms are very intermittent, less than once per week she can use as needed ranitidine 150 mg twice daily as needed.  If symptoms are more frequent after being off PPI, she is asked to notify me and we can consider resuming PPI therapy.  She is happy with this plan  2.  CRC screening --colonoscopy recommended at age 39  for screening, average risk.  Follow-up as needed 15 minutes spent with the patient today. Greater than 50% was spent in counseling and coordination of care with the patient

## 2017-06-01 ENCOUNTER — Other Ambulatory Visit: Payer: Self-pay | Admitting: Internal Medicine

## 2017-11-28 ENCOUNTER — Other Ambulatory Visit: Payer: Self-pay | Admitting: Internal Medicine

## 2017-12-18 DIAGNOSIS — F4321 Adjustment disorder with depressed mood: Secondary | ICD-10-CM | POA: Diagnosis not present

## 2017-12-25 DIAGNOSIS — Z6824 Body mass index (BMI) 24.0-24.9, adult: Secondary | ICD-10-CM | POA: Diagnosis not present

## 2017-12-25 DIAGNOSIS — N760 Acute vaginitis: Secondary | ICD-10-CM | POA: Diagnosis not present

## 2017-12-25 DIAGNOSIS — N390 Urinary tract infection, site not specified: Secondary | ICD-10-CM | POA: Diagnosis not present

## 2018-01-11 DIAGNOSIS — F4321 Adjustment disorder with depressed mood: Secondary | ICD-10-CM | POA: Diagnosis not present

## 2018-01-22 DIAGNOSIS — F4321 Adjustment disorder with depressed mood: Secondary | ICD-10-CM | POA: Diagnosis not present

## 2018-03-05 DIAGNOSIS — F4321 Adjustment disorder with depressed mood: Secondary | ICD-10-CM | POA: Diagnosis not present

## 2018-04-06 DIAGNOSIS — N921 Excessive and frequent menstruation with irregular cycle: Secondary | ICD-10-CM | POA: Diagnosis not present

## 2018-04-06 DIAGNOSIS — Z01419 Encounter for gynecological examination (general) (routine) without abnormal findings: Secondary | ICD-10-CM | POA: Diagnosis not present

## 2018-04-06 DIAGNOSIS — Z6823 Body mass index (BMI) 23.0-23.9, adult: Secondary | ICD-10-CM | POA: Diagnosis not present

## 2018-05-27 ENCOUNTER — Other Ambulatory Visit: Payer: Self-pay | Admitting: Internal Medicine

## 2019-04-15 ENCOUNTER — Other Ambulatory Visit: Payer: Self-pay | Admitting: Internal Medicine

## 2019-04-15 NOTE — Telephone Encounter (Signed)
Not sure why this came in Dr. Lamar Sprinkles name because it is a [facility] patient - looked like he wanted to wean her off it

## 2019-04-22 ENCOUNTER — Encounter: Payer: Self-pay | Admitting: Gastroenterology

## 2019-04-22 ENCOUNTER — Other Ambulatory Visit: Payer: Self-pay

## 2019-04-22 ENCOUNTER — Ambulatory Visit (INDEPENDENT_AMBULATORY_CARE_PROVIDER_SITE_OTHER): Payer: BC Managed Care – PPO | Admitting: Gastroenterology

## 2019-04-22 VITALS — BP 104/60 | HR 72 | Temp 97.6°F | Ht 64.75 in | Wt 143.0 lb

## 2019-04-22 DIAGNOSIS — K219 Gastro-esophageal reflux disease without esophagitis: Secondary | ICD-10-CM | POA: Diagnosis not present

## 2019-04-22 MED ORDER — PANTOPRAZOLE SODIUM 40 MG PO TBEC: 40.0000 mg | DELAYED_RELEASE_TABLET | Freq: Every day | ORAL | 4 refills | Status: DC

## 2019-04-22 NOTE — Progress Notes (Signed)
     04/22/2019 Catherine Sanders [id number] 03-24-1980   HISTORY OF PRESENT ILLNESS: This is a pleasant 39 year old female who is a patient of Dr. Lauro Franklin.  She follows here for treatment of her GERD.  Her last visit here was 2 years ago, February 2019.  She is here today for refill on her pantoprazole 40 mg daily.  When she was here last they had discussion in regards to weaning off of the pantoprazole with going to every other day and then changing to ranitidine at that point.  She says that she tried to wean off of it, but not did not go well.  Reflux symptoms were bad.  She resumed taking this daily and does well without any issues as long as she takes it.  No dysphagia or abdominal pain.  Moves her bowels well without any problems.  No family history of colon cancer.   Past Medical History:  Diagnosis Date  . GERD (gastroesophageal reflux disease)   . Inappropriate sinus node tachycardia    Past Surgical History:  Procedure Laterality Date  . TONSILLECTOMY      reports that she has quit smoking. Her smoking use included cigarettes. She has never used smokeless tobacco. She reports current alcohol use. She reports that she does not use drugs. family history includes Diabetes in her maternal grandmother; Throat cancer in her paternal uncle. No Known Allergies    Outpatient Encounter Medications as of 04/22/2019  Medication Sig  . pantoprazole (PROTONIX) 40 MG tablet TAKE 1 TABLET DAILY   No facility-administered encounter medications on file as of 04/22/2019.     REVIEW OF SYSTEMS  : All other systems reviewed and negative except where noted in the History of Present Illness.   PHYSICAL EXAM: BP 104/60 (BP Location: Left Arm, Patient Position: Sitting, Cuff Size: Normal)   Pulse 72   Temp 97.6 F (36.4 C)   Ht 5' 4.75" (1.645 m)   Wt 143 lb (64.9 kg)   LMP 04/19/2019   BMI 23.98 kg/m  General: Well developed white female in no acute distress Head: Normocephalic and  atraumatic Eyes:  Sclerae anicteric, conjunctiva pink. Ears: Normal auditory acuity Lungs: Clear throughout to auscultation; no increased WOB. Heart: Regular rate and rhythm; no M/R/G. Abdomen: Soft, non-distended.  BS present.  Non-tender. Musculoskeletal: Symmetrical with no Dueitt deformities  Skin: No lesions on visible extremities Extremities: No edema  Neurological: Alert oriented x 4, grossly non-focal Psychological:  Alert and cooperative. Normal mood and affect  ASSESSMENT AND PLAN: *GERD: Well-controlled on pantoprazole 40 mg daily.  Had tried to wean herself off of it in the past and symptoms return.  Symptoms well controlled as long as she takes this every day.  Needs a new prescription for 90-day supply.  Prescription sent.  We will follow-up in 1 year or sooner if needed. *Colorectal cancer screening: Advised that she will need colonoscopy for colorectal cancer screening at age 37.   CC:  Annamaria Helling, MD

## 2019-04-22 NOTE — Patient Instructions (Addendum)
If you are age 41 or older, your body mass index should be between 23-30. Your Body mass index is 23.98 kg/m. If this is out of the aforementioned range listed, please consider follow up with your Primary Care Provider.  If you are age 13 or younger, your body mass index should be between 19-25. Your Body mass index is 23.98 kg/m. If this is out of the aformentioned range listed, please consider follow up with your Primary Care Provider.   We have sent the following medications to your pharmacy for you to pick up at your convenience: Pantoprazole 40 mg daily.   Follow up in 1 year or sooner if needed.

## 2019-04-24 NOTE — Progress Notes (Signed)
Addendum: Reviewed and agree with assessment and management plan. Marionna Gonia M, MD  

## 2019-05-06 DIAGNOSIS — Z6823 Body mass index (BMI) 23.0-23.9, adult: Secondary | ICD-10-CM | POA: Diagnosis not present

## 2019-05-06 DIAGNOSIS — Z01419 Encounter for gynecological examination (general) (routine) without abnormal findings: Secondary | ICD-10-CM | POA: Diagnosis not present

## 2019-05-06 DIAGNOSIS — Z124 Encounter for screening for malignant neoplasm of cervix: Secondary | ICD-10-CM | POA: Diagnosis not present

## 2019-05-06 DIAGNOSIS — Z1231 Encounter for screening mammogram for malignant neoplasm of breast: Secondary | ICD-10-CM | POA: Diagnosis not present

## 2020-02-11 DIAGNOSIS — L820 Inflamed seborrheic keratosis: Secondary | ICD-10-CM | POA: Diagnosis not present

## 2020-02-11 DIAGNOSIS — L821 Other seborrheic keratosis: Secondary | ICD-10-CM | POA: Diagnosis not present

## 2020-02-11 DIAGNOSIS — D229 Melanocytic nevi, unspecified: Secondary | ICD-10-CM | POA: Diagnosis not present

## 2020-02-11 DIAGNOSIS — L814 Other melanin hyperpigmentation: Secondary | ICD-10-CM | POA: Diagnosis not present

## 2020-03-13 DIAGNOSIS — F4321 Adjustment disorder with depressed mood: Secondary | ICD-10-CM | POA: Diagnosis not present

## 2020-04-10 DIAGNOSIS — F4321 Adjustment disorder with depressed mood: Secondary | ICD-10-CM | POA: Diagnosis not present

## 2020-05-08 DIAGNOSIS — F4321 Adjustment disorder with depressed mood: Secondary | ICD-10-CM | POA: Diagnosis not present

## 2020-05-25 DIAGNOSIS — Z01419 Encounter for gynecological examination (general) (routine) without abnormal findings: Secondary | ICD-10-CM | POA: Diagnosis not present

## 2020-05-25 DIAGNOSIS — Z1231 Encounter for screening mammogram for malignant neoplasm of breast: Secondary | ICD-10-CM | POA: Diagnosis not present

## 2020-05-25 DIAGNOSIS — Z6824 Body mass index (BMI) 24.0-24.9, adult: Secondary | ICD-10-CM | POA: Diagnosis not present

## 2020-06-05 DIAGNOSIS — F4321 Adjustment disorder with depressed mood: Secondary | ICD-10-CM | POA: Diagnosis not present

## 2020-07-10 DIAGNOSIS — F4321 Adjustment disorder with depressed mood: Secondary | ICD-10-CM | POA: Diagnosis not present

## 2020-07-16 ENCOUNTER — Telehealth: Payer: Self-pay | Admitting: Gastroenterology

## 2020-07-16 NOTE — Telephone Encounter (Signed)
Inbound call from patient need medication Protonix refill. [facility] [address] in [place]. Appointment scheduled 7/1 with Shanda Bumps.

## 2020-07-17 ENCOUNTER — Other Ambulatory Visit: Payer: Self-pay | Admitting: Gastroenterology

## 2020-07-17 NOTE — Telephone Encounter (Signed)
Script sent to pharmacy.

## 2020-08-07 DIAGNOSIS — F4321 Adjustment disorder with depressed mood: Secondary | ICD-10-CM | POA: Diagnosis not present

## 2020-09-04 ENCOUNTER — Encounter: Payer: Self-pay | Admitting: Gastroenterology

## 2020-09-04 ENCOUNTER — Ambulatory Visit (INDEPENDENT_AMBULATORY_CARE_PROVIDER_SITE_OTHER): Payer: BC Managed Care – PPO | Admitting: Gastroenterology

## 2020-09-04 VITALS — BP 110/60 | HR 86 | Ht 65.0 in | Wt 144.0 lb

## 2020-09-04 DIAGNOSIS — K219 Gastro-esophageal reflux disease without esophagitis: Secondary | ICD-10-CM

## 2020-09-04 MED ORDER — PANTOPRAZOLE SODIUM 40 MG PO TBEC: 40.0000 mg | DELAYED_RELEASE_TABLET | Freq: Every day | ORAL | 3 refills | Status: AC

## 2020-09-04 NOTE — Patient Instructions (Signed)
If you are age 42 or older, your body mass index should be between 23-30. Your Body mass index is 23.96 kg/m. If this is out of the aforementioned range listed, please consider follow up with your Primary Care Provider.  If you are age 72 or younger, your body mass index should be between 19-25. Your Body mass index is 23.96 kg/m. If this is out of the aformentioned range listed, please consider follow up with your Primary Care Provider.   We have sent the following medications to your pharmacy for you to pick up at your convenience: Pantoprazole 40 mg.  The [facility] providers would like to encourage you to use [facility] to communicate with providers for non-urgent requests or questions.  Due to long hold times on the telephone, sending your provider a message by [facility] may be a faster and more efficient way to get a response.  Please allow 48 business hours for a response.  Please remember that this is for non-urgent requests.   It was a pleasure to see you today!  Thank you for trusting me with your gastrointestinal care!    Doug Sou, PA-C

## 2020-09-04 NOTE — Progress Notes (Signed)
     09/04/2020 Catherine Sanders [id number] [birth year]   HISTORY OF PRESENT ILLNESS: This is a 40 year old female is a patient of Dr. Lauro Franklin.  She is here for follow-up of her GERD.  He was last seen by me in February 2021.  She has continued on her pantoprazole 40 mg daily and that has been working really well for her.  She had tried to wean off the pantoprazole and take Zantac instead in the past, but that did not go well and her acid reflux symptoms were really bad.  She recently started Zoloft and has noticed a little bit of increase in indigestion since starting that.  It does have a side effect of dyspepsia, but hopefully that will wear off with time.   Past Medical History:  Diagnosis Date   GERD (gastroesophageal reflux disease)    Inappropriate sinus node tachycardia    Past Surgical History:  Procedure Laterality Date   TONSILLECTOMY      reports that she has quit smoking. Her smoking use included cigarettes. She has never used smokeless tobacco. She reports current alcohol use. She reports that she does not use drugs. family history includes Diabetes in her maternal grandmother; Throat cancer in her paternal uncle. No Known Allergies    Outpatient Encounter Medications as of 09/04/2020  Medication Sig   fexofenadine (ALLEGRA) 60 MG tablet Take by mouth.   pantoprazole (PROTONIX) 40 MG tablet TAKE 1 TABLET(40 MG) BY MOUTH DAILY   sertraline (ZOLOFT) 25 MG tablet sertraline 25 mg tablet  TAKE 1 TABLET BY MOUTH EVERY DAY   valACYclovir (VALTREX) 500 MG tablet valacyclovir 500 mg tablet   No facility-administered encounter medications on file as of 09/04/2020.     REVIEW OF SYSTEMS  : All other systems reviewed and negative except where noted in the History of Present Illness.   PHYSICAL EXAM: BP 110/60   Pulse 86   Ht 5\' 5"  (1.651 m)   Wt 144 lb (65.3 kg)   BMI 23.96 kg/m  General: Well developed white female in no acute distress Head: Normocephalic and atraumatic Eyes:   Sclerae anicteric, conjunctiva pink. Ears: Normal auditory acuity Lungs: Clear throughout to auscultation; no W/R/R. Heart: Regular rate and rhythm; no M/R/G. Abdomen: Soft, non-distended.  BS present.  Non-tender. Musculoskeletal: Symmetrical with no Castelo deformities  Skin: No lesions on visible extremities Extremities: No edema  Neurological: Alert oriented x 4, grossly non-focal Psychological:  Alert and cooperative. Normal mood and affect  ASSESSMENT AND PLAN: *GERD: Well-controlled on pantoprazole 40 mg daily.  Needs a new prescription.  Will fill for 90-day supply enough refills for a year.  We also discussed that if she wanted to try at some point going to Pepcid twice daily instead to see if that would work for her, but she had tried to do that with zantac in the past and it did not work.  If not then she will maintain on the pantoprazole.  CC:  , MD

## 2020-09-10 NOTE — Progress Notes (Signed)
Addendum: Reviewed and agree with assessment and management plan. Debi Cousin M, MD  

## 2020-09-11 DIAGNOSIS — F4321 Adjustment disorder with depressed mood: Secondary | ICD-10-CM | POA: Diagnosis not present

## 2020-10-20 DIAGNOSIS — M9904 Segmental and somatic dysfunction of sacral region: Secondary | ICD-10-CM | POA: Diagnosis not present

## 2020-10-20 DIAGNOSIS — M9902 Segmental and somatic dysfunction of thoracic region: Secondary | ICD-10-CM | POA: Diagnosis not present

## 2020-10-20 DIAGNOSIS — M9903 Segmental and somatic dysfunction of lumbar region: Secondary | ICD-10-CM | POA: Diagnosis not present

## 2020-10-20 DIAGNOSIS — M9901 Segmental and somatic dysfunction of cervical region: Secondary | ICD-10-CM | POA: Diagnosis not present

## 2021-02-16 DIAGNOSIS — X32XXXS Exposure to sunlight, sequela: Secondary | ICD-10-CM | POA: Diagnosis not present

## 2021-02-16 DIAGNOSIS — D485 Neoplasm of uncertain behavior of skin: Secondary | ICD-10-CM | POA: Diagnosis not present

## 2021-02-16 DIAGNOSIS — L814 Other melanin hyperpigmentation: Secondary | ICD-10-CM | POA: Diagnosis not present

## 2021-02-16 DIAGNOSIS — D229 Melanocytic nevi, unspecified: Secondary | ICD-10-CM | POA: Diagnosis not present

## 2021-02-16 DIAGNOSIS — L218 Other seborrheic dermatitis: Secondary | ICD-10-CM | POA: Diagnosis not present

## 2021-02-16 DIAGNOSIS — C44519 Basal cell carcinoma of skin of other part of trunk: Secondary | ICD-10-CM | POA: Diagnosis not present

## 2021-03-04 DIAGNOSIS — C44519 Basal cell carcinoma of skin of other part of trunk: Secondary | ICD-10-CM | POA: Diagnosis not present

## 2021-05-26 DIAGNOSIS — Z01419 Encounter for gynecological examination (general) (routine) without abnormal findings: Secondary | ICD-10-CM | POA: Diagnosis not present

## 2021-05-26 DIAGNOSIS — Z0000 Encounter for general adult medical examination without abnormal findings: Secondary | ICD-10-CM | POA: Diagnosis not present

## 2021-05-26 DIAGNOSIS — Z6824 Body mass index (BMI) 24.0-24.9, adult: Secondary | ICD-10-CM | POA: Diagnosis not present

## 2021-05-26 DIAGNOSIS — Z1231 Encounter for screening mammogram for malignant neoplasm of breast: Secondary | ICD-10-CM | POA: Diagnosis not present

## 2021-05-26 DIAGNOSIS — Z124 Encounter for screening for malignant neoplasm of cervix: Secondary | ICD-10-CM | POA: Diagnosis not present

## 2021-06-03 DIAGNOSIS — L821 Other seborrheic keratosis: Secondary | ICD-10-CM | POA: Diagnosis not present

## 2021-06-03 DIAGNOSIS — D225 Melanocytic nevi of trunk: Secondary | ICD-10-CM | POA: Diagnosis not present

## 2021-06-03 DIAGNOSIS — X32XXXS Exposure to sunlight, sequela: Secondary | ICD-10-CM | POA: Diagnosis not present

## 2021-06-03 DIAGNOSIS — L814 Other melanin hyperpigmentation: Secondary | ICD-10-CM | POA: Diagnosis not present

## 2021-06-09 ENCOUNTER — Other Ambulatory Visit: Payer: Self-pay | Admitting: Obstetrics & Gynecology

## 2021-06-09 DIAGNOSIS — R928 Other abnormal and inconclusive findings on diagnostic imaging of breast: Secondary | ICD-10-CM

## 2021-06-10 ENCOUNTER — Ambulatory Visit
Admission: RE | Admit: 2021-06-10 | Discharge: 2021-06-10 | Disposition: A | Discharge status: Home / Self Care | Payer: BC Managed Care – PPO | Source: Ambulatory Visit | Attending: Obstetrics & Gynecology | Admitting: Obstetrics & Gynecology

## 2021-06-10 DIAGNOSIS — R928 Other abnormal and inconclusive findings on diagnostic imaging of breast: Secondary | ICD-10-CM

## 2021-06-10 DIAGNOSIS — N6002 Solitary cyst of left breast: Secondary | ICD-10-CM | POA: Diagnosis not present

## 2021-07-28 ENCOUNTER — Encounter: Payer: Self-pay | Admitting: Gastroenterology

## 2021-11-23 DIAGNOSIS — L814 Other melanin hyperpigmentation: Secondary | ICD-10-CM | POA: Diagnosis not present

## 2021-11-23 DIAGNOSIS — Z85828 Personal history of other malignant neoplasm of skin: Secondary | ICD-10-CM | POA: Diagnosis not present

## 2021-11-23 DIAGNOSIS — X32XXXS Exposure to sunlight, sequela: Secondary | ICD-10-CM | POA: Diagnosis not present

## 2021-11-23 DIAGNOSIS — D229 Melanocytic nevi, unspecified: Secondary | ICD-10-CM | POA: Diagnosis not present

## 2022-06-01 DIAGNOSIS — Z1231 Encounter for screening mammogram for malignant neoplasm of breast: Secondary | ICD-10-CM | POA: Diagnosis not present

## 2022-06-01 DIAGNOSIS — Z01419 Encounter for gynecological examination (general) (routine) without abnormal findings: Secondary | ICD-10-CM | POA: Diagnosis not present

## 2022-06-01 DIAGNOSIS — Z008 Encounter for other general examination: Secondary | ICD-10-CM | POA: Diagnosis not present

## 2022-06-01 DIAGNOSIS — Z6824 Body mass index (BMI) 24.0-24.9, adult: Secondary | ICD-10-CM | POA: Diagnosis not present

## 2022-07-15 IMAGING — US US BREAST*L* LIMITED INC AXILLA
1 series · 7 of 7 positions shown · non-contrast
Comparison: 05/26/2021

CLINICAL DATA: Patient returns after screening study for evaluation
of possible LEFT breast mass. The patient's mother was diagnosed
with cancer at age 61.

EXAM:
ULTRASOUND OF THE LEFT BREAST

[Series 1: us breast*left* limited inc axilla · 0.06mm/px · 7 of 7 slices shown]
[im 1/7]
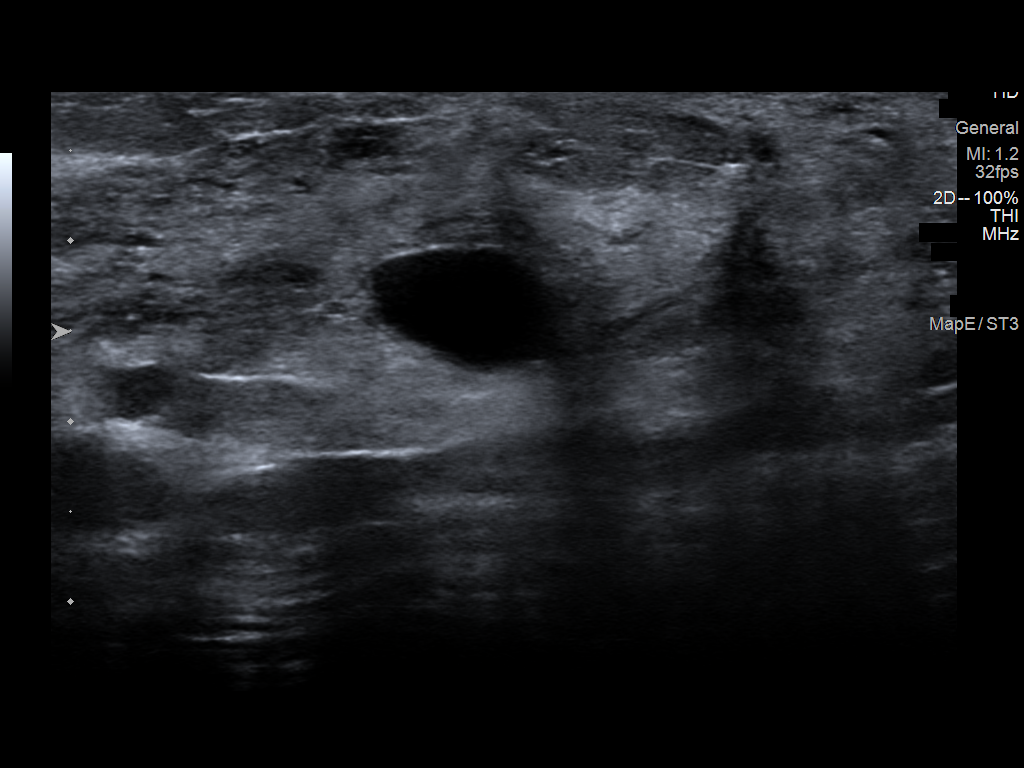
[im 2/7]
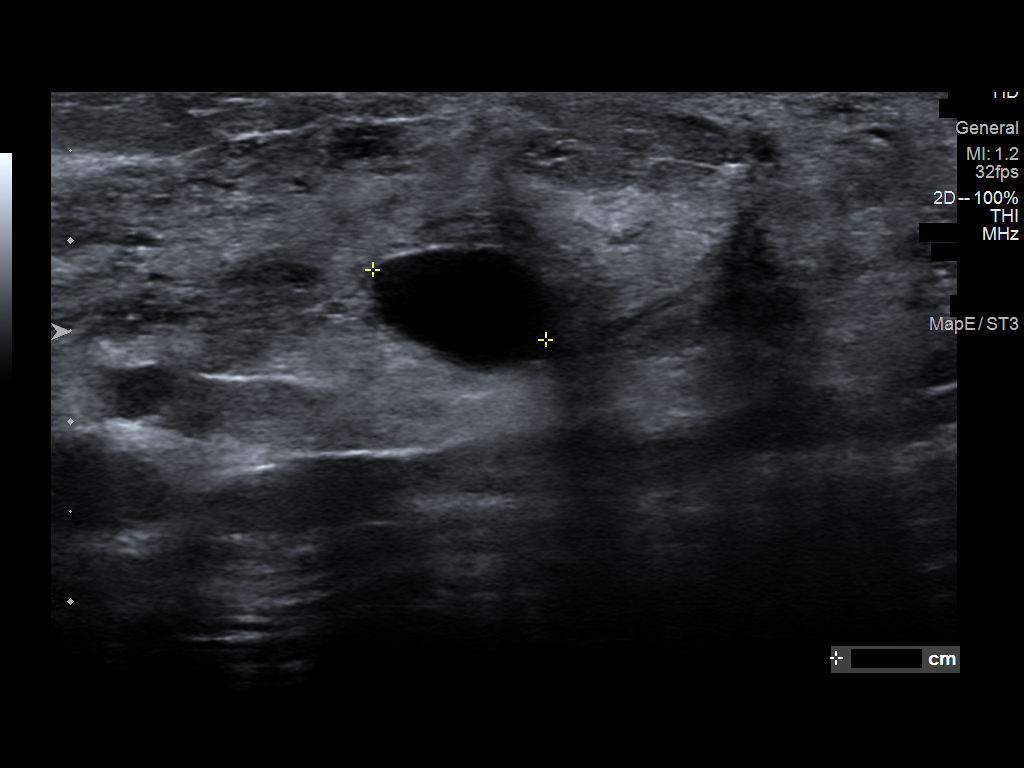
[im 3/7]
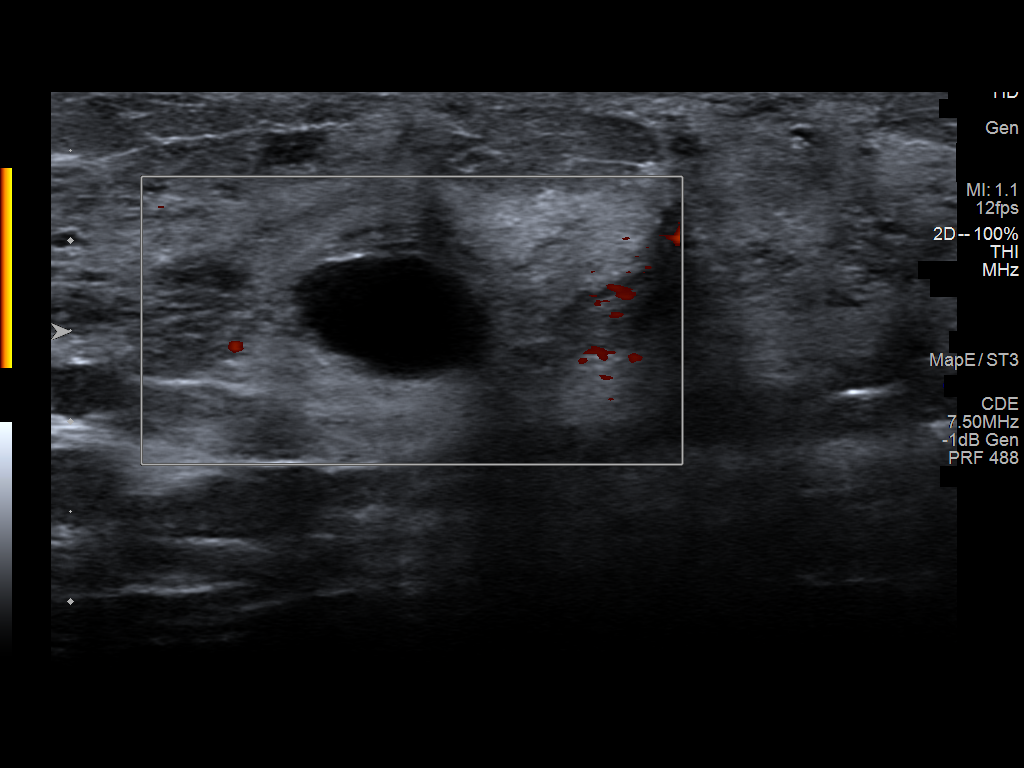
[im 4/7]
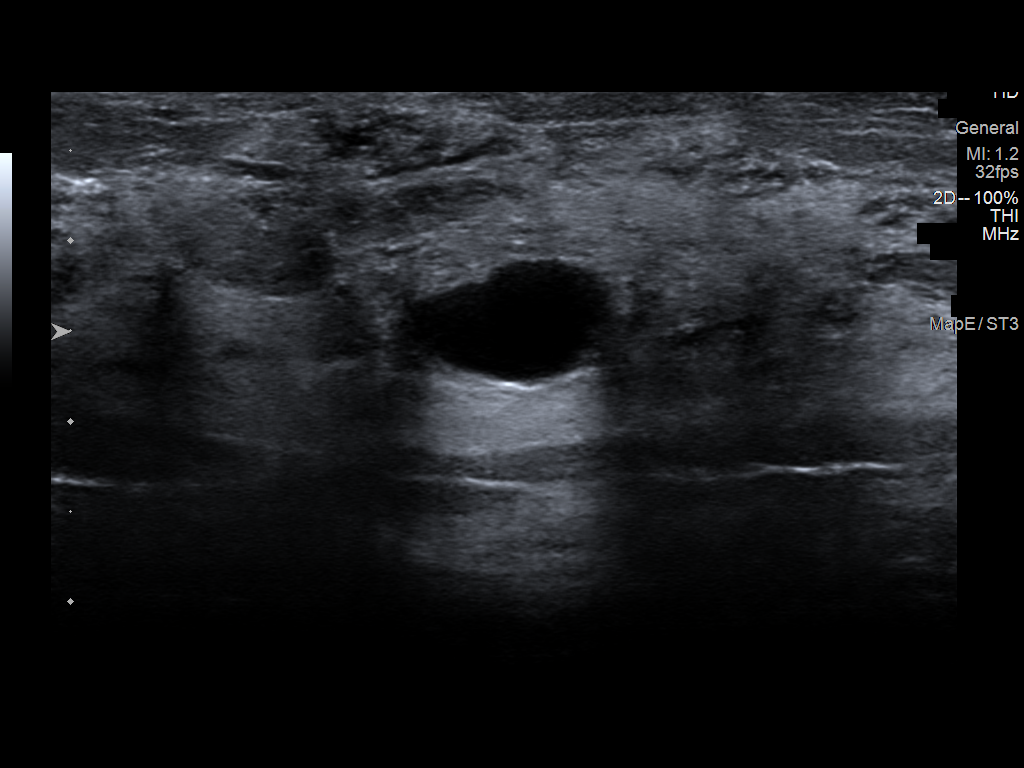
[im 5/7]
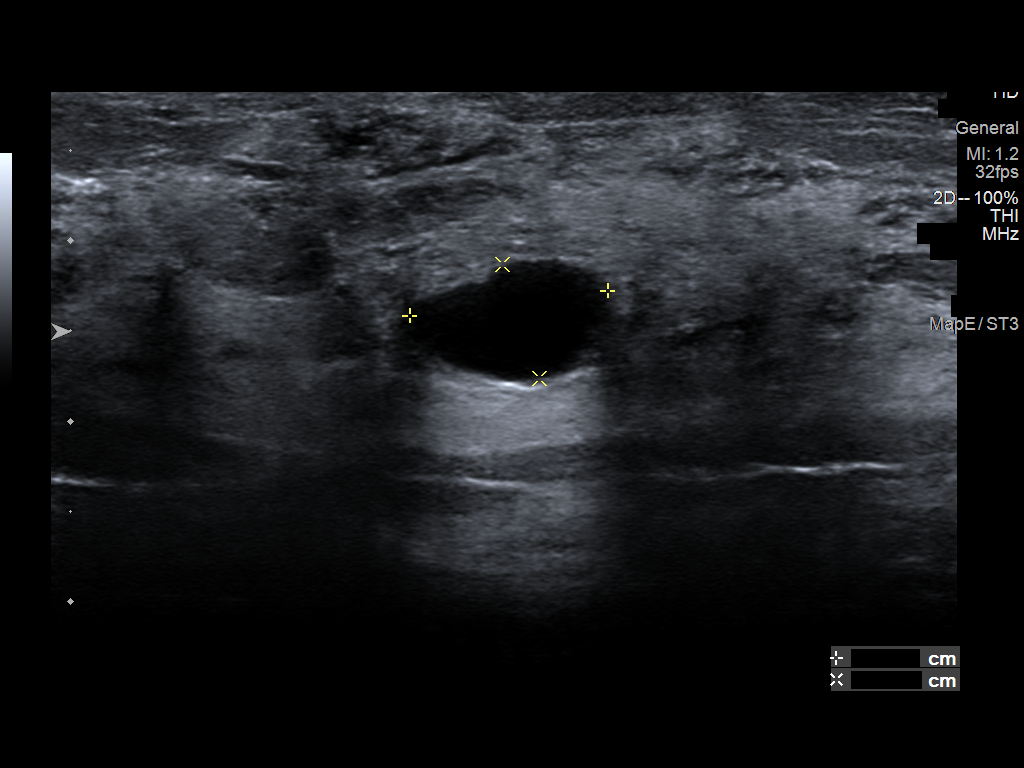
[im 6/7]
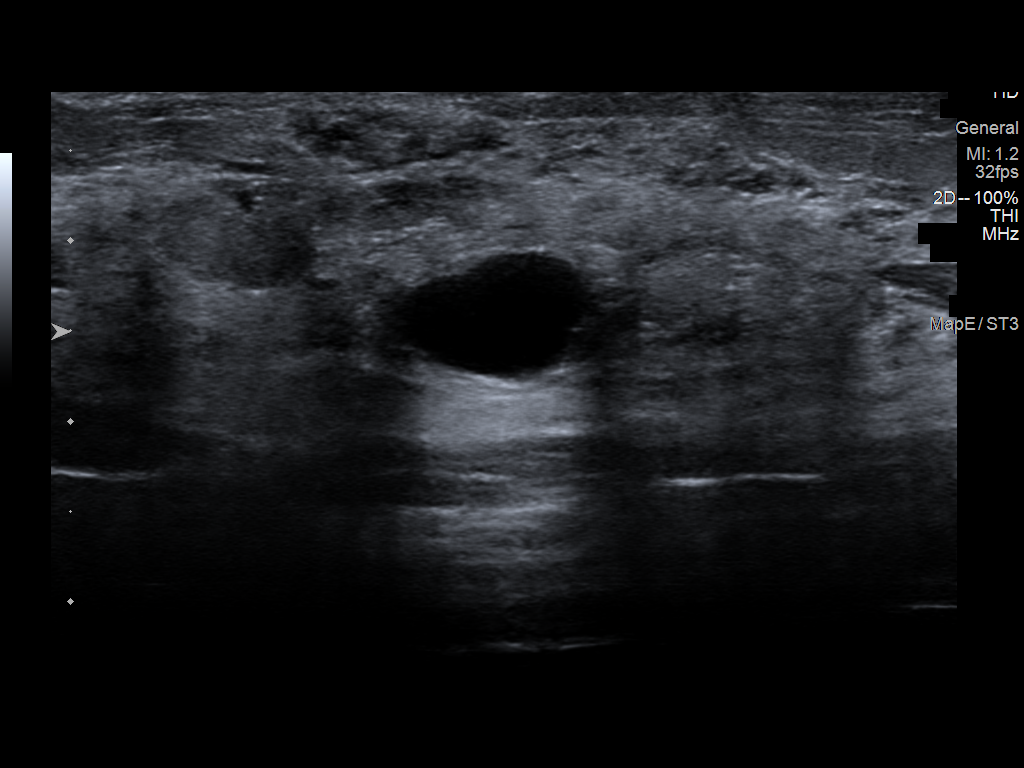
[im 7/7]
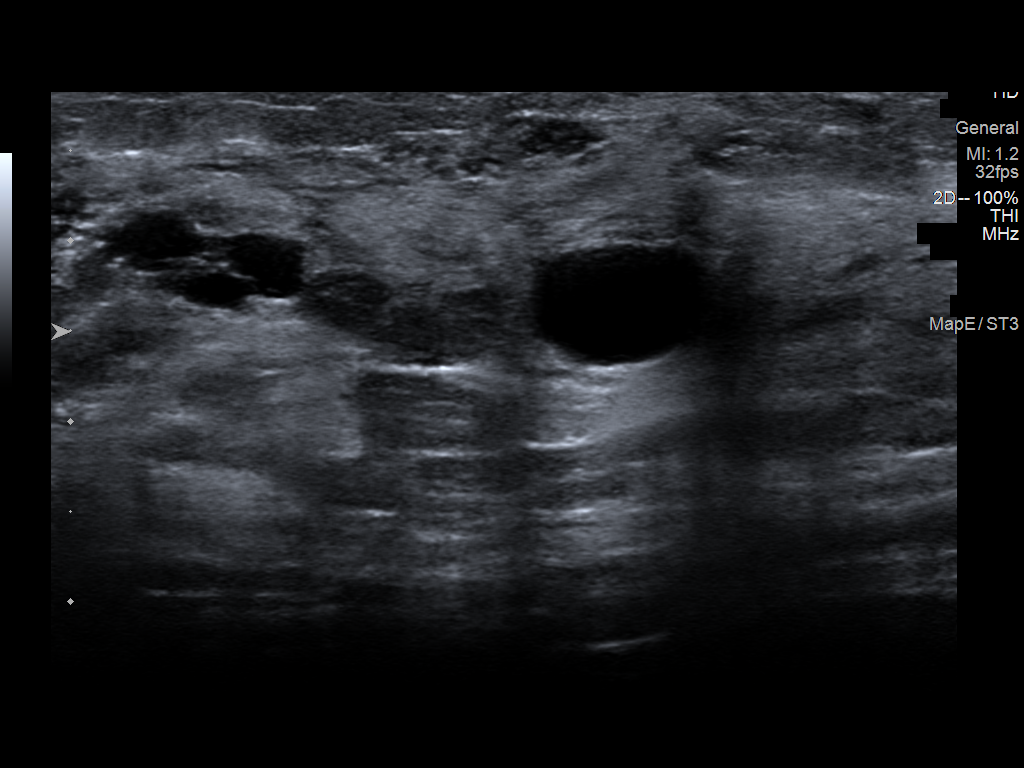

[7 of 7 positions shown; findings below may reference images not displayed]

FINDINGS: Targeted ultrasound is performed, showing a simple cyst in the 12
o'clock retroareolar region of the LEFT breast which measures 1.0 x
1.1 x 0.7 centimeters and correlates well with the mammographic
appearance. Scattered sub centimeters cysts are also identified in
the same region. No suspicious mass, distortion, or acoustic
shadowing is demonstrated with ultrasound.
IMPRESSION: There is no sonographic evidence for malignancy.

RECOMMENDATION:
Screening mammogram in one year.(Code:4F-3-BIJ)

I have discussed the findings and recommendations with the patient.
If applicable, a reminder letter will be sent to the patient
regarding the next appointment.

BI-RADS CATEGORY  2: Benign.

## 2022-11-15 DIAGNOSIS — N921 Excessive and frequent menstruation with irregular cycle: Secondary | ICD-10-CM | POA: Diagnosis not present

## 2022-12-12 DIAGNOSIS — Z85828 Personal history of other malignant neoplasm of skin: Secondary | ICD-10-CM | POA: Diagnosis not present

## 2022-12-12 DIAGNOSIS — D229 Melanocytic nevi, unspecified: Secondary | ICD-10-CM | POA: Diagnosis not present

## 2022-12-12 DIAGNOSIS — L821 Other seborrheic keratosis: Secondary | ICD-10-CM | POA: Diagnosis not present

## 2022-12-12 DIAGNOSIS — L814 Other melanin hyperpigmentation: Secondary | ICD-10-CM | POA: Diagnosis not present
